# Patient Record
Sex: Female | Born: 1980 | Race: White | Hispanic: No | Marital: Single
Health system: Southern US, Community
[De-identification: ages and names within clinical notes are randomized; demographics above are authoritative.]

---

## 2005-06-13 ENCOUNTER — Other Ambulatory Visit: Admission: RE | Admit: 2005-06-13 | Discharge: 2005-06-13 | Payer: Self-pay | Admitting: Obstetrics and Gynecology

## 2006-04-30 ENCOUNTER — Ambulatory Visit: Payer: Self-pay | Admitting: Family Medicine

## 2006-05-12 ENCOUNTER — Ambulatory Visit: Payer: Self-pay | Admitting: Family Medicine

## 2006-05-16 ENCOUNTER — Ambulatory Visit: Payer: Self-pay | Admitting: Cardiology

## 2006-07-03 ENCOUNTER — Ambulatory Visit: Payer: Self-pay | Admitting: Family Medicine

## 2007-11-14 IMAGING — CT CT PELVIS W/ CM
2 of 6 series · 17 of 46 positions shown, 19 images · IV contrast (APPLIED)
Comparison: none

HISTORY: Recurrent UTI, family history renal disease, past history basal cell
carcinoma

[Series 2: abd_pel 5.0 b30f st · axial · 0.68mm/px · z∈[-675,-265]mm · 14 of 92 slices shown, 16 images]
[im 5/92  soft-tissue]
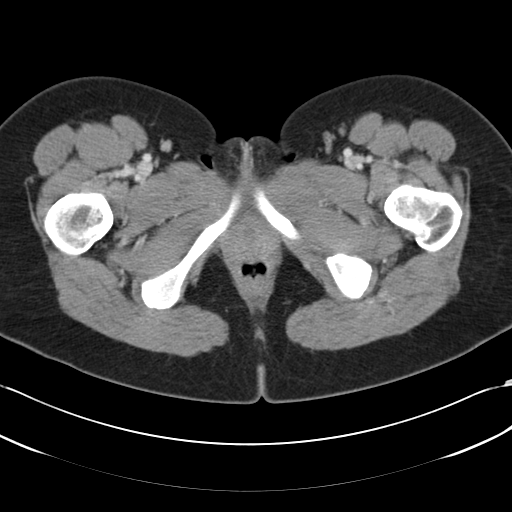
[im 5/92  bone]
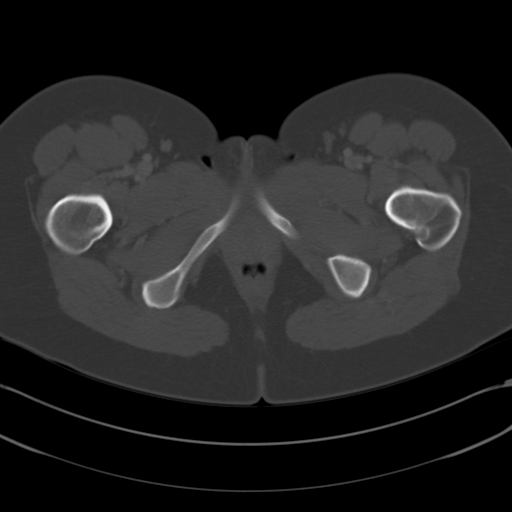
[im 14/92  soft-tissue]
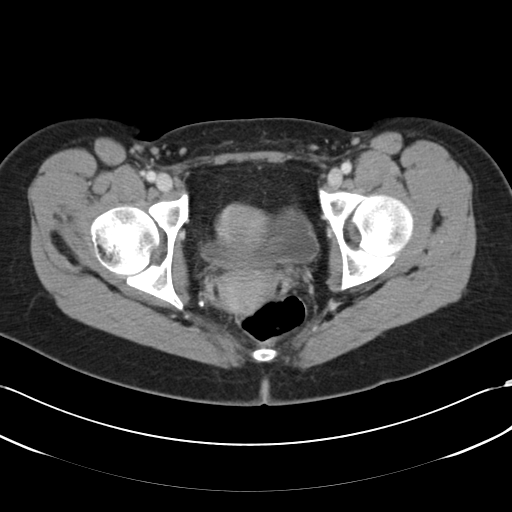
[im 19/92  soft-tissue]
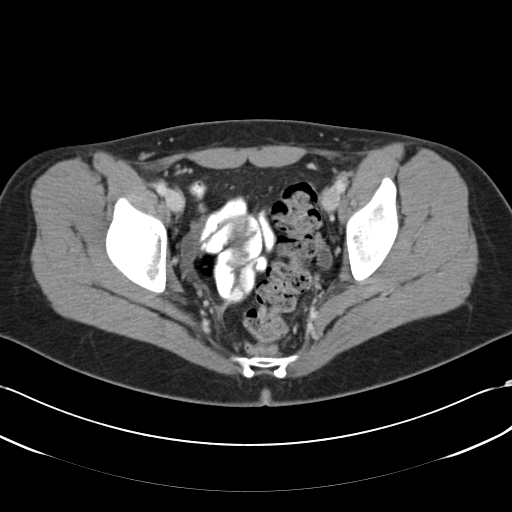
[im 23/92  soft-tissue]
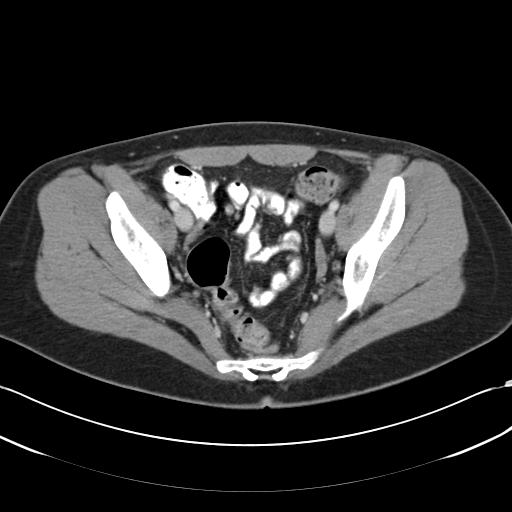
[im 32/92  soft-tissue]
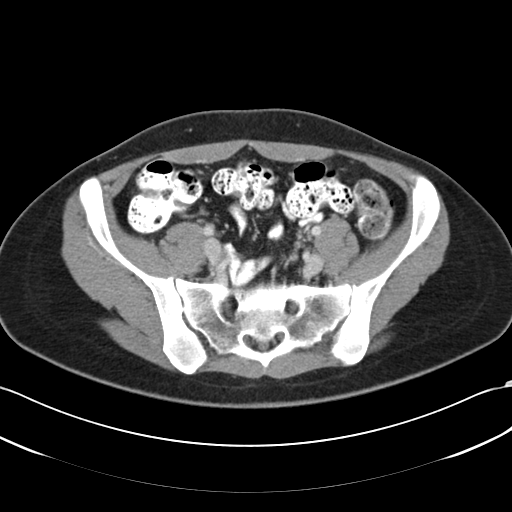
[im 37/92  soft-tissue]
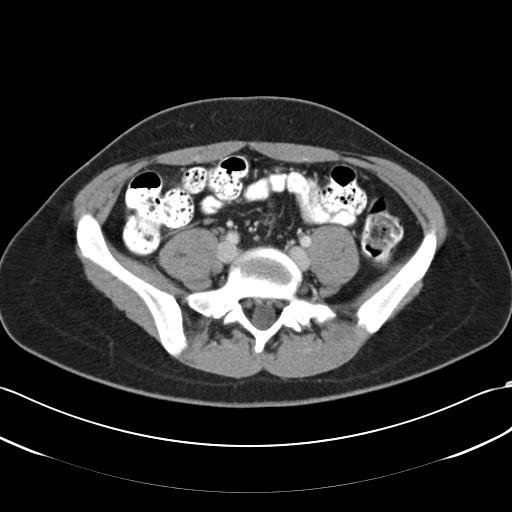
[im 41/92  soft-tissue]
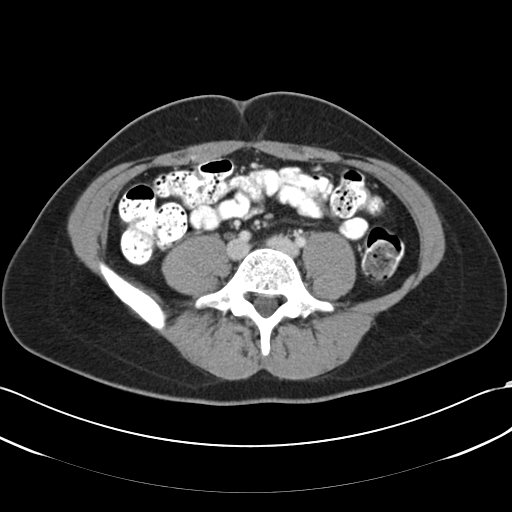
[im 51/92  soft-tissue]
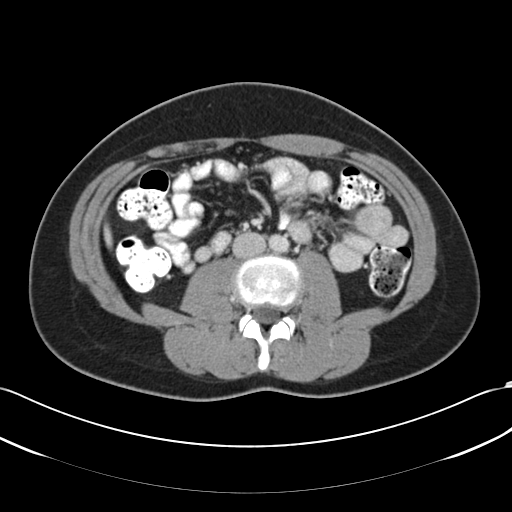
[im 55/92  soft-tissue]
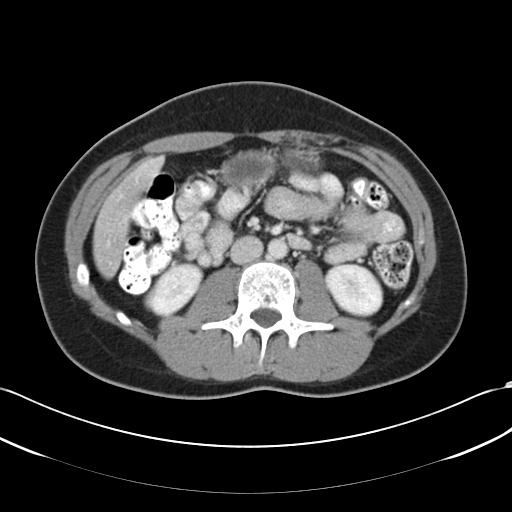
[im 55/92  bone]
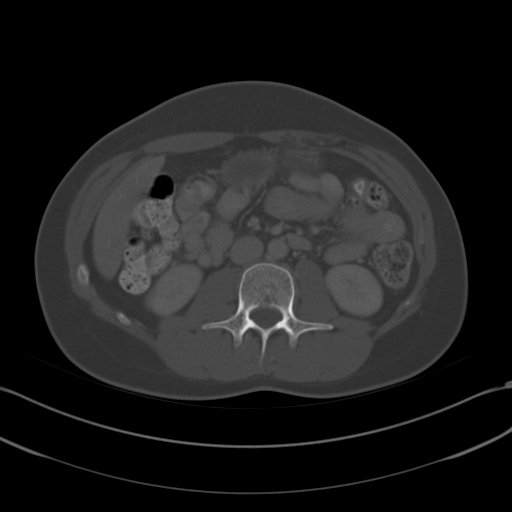
[im 60/92  soft-tissue]
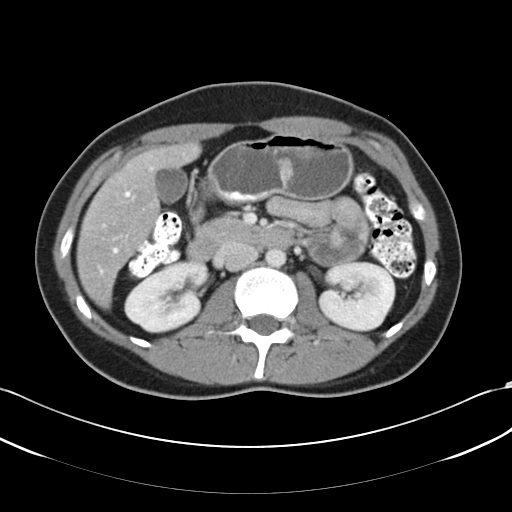
[im 69/92  soft-tissue]
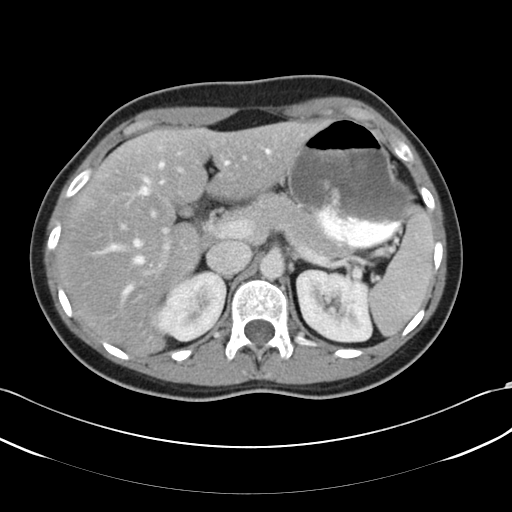
[im 73/92  soft-tissue]
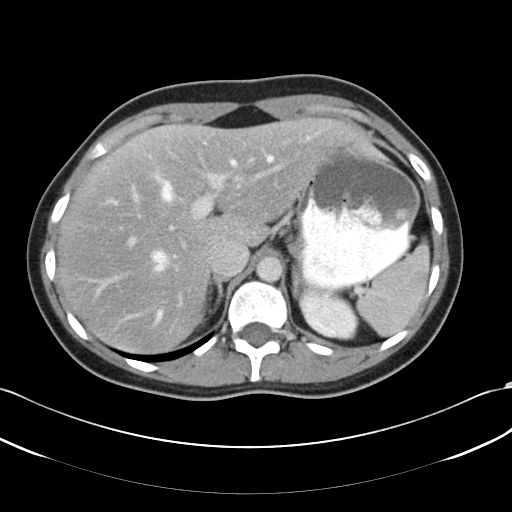
[im 78/92  soft-tissue]
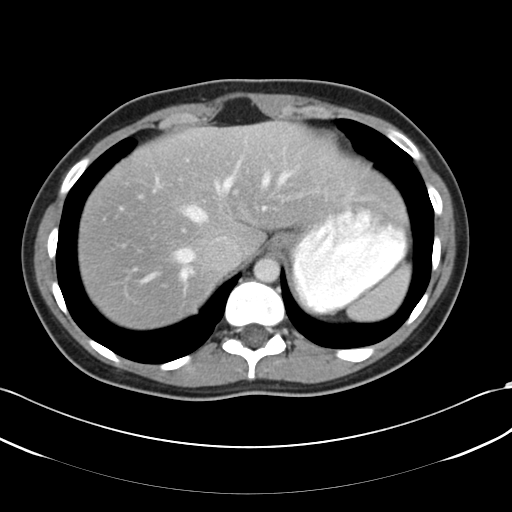
[im 87/92  soft-tissue]
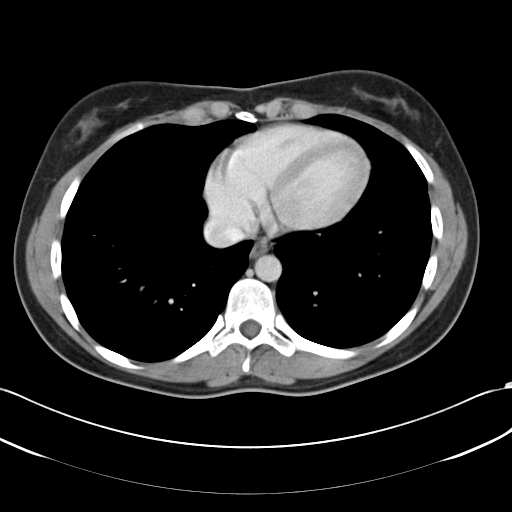

[Series 602: coronal images · coronal · 0.93mm/px · 3 of 39 slices shown]
[im 13/39  soft-tissue]
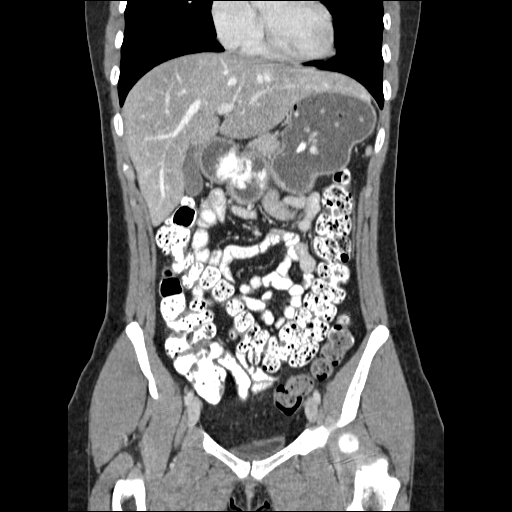
[im 17/39  soft-tissue]
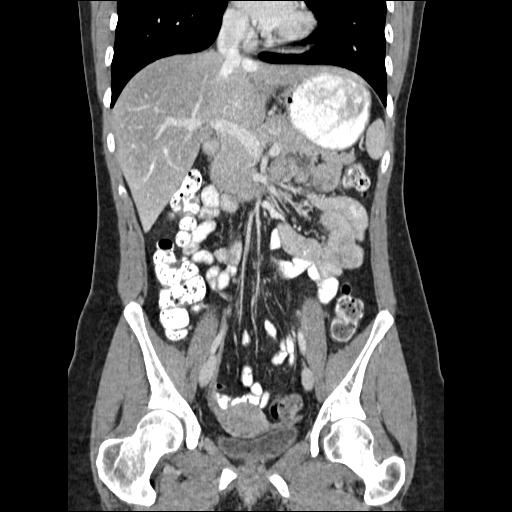
[im 22/39  soft-tissue]
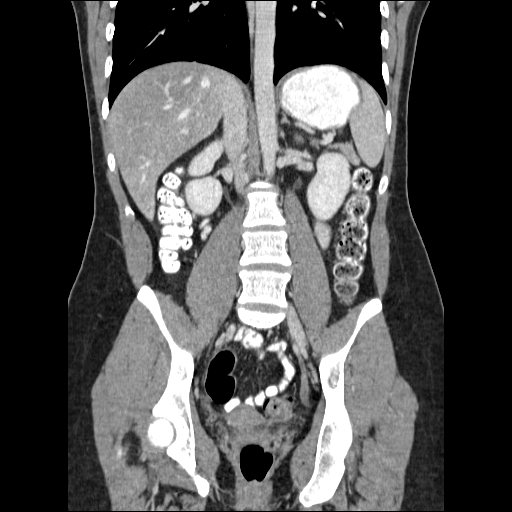

[17 of 46 positions shown; findings below may reference images not displayed]

CT ABDOMEN AND PELVIS WITH CONTRAST:

Multidetector helical CT imaging abdomen and pelvis performed.
Exam utilized dilute oral contrast and 125 cc [id number].
No prior study for comparison.

CT ABDOMEN:

Lung bases clear.
Small soft tissue nodule adjacent to stomach and spleen 8 mm diameter, similar
enhancement the spleen, question splenule versus lymph node.
Mild fatty infiltration of liver.
Liver, spleen, pancreas, kidneys, and adrenal glands otherwise normal.
Symmetric nephrograms.
No upper abdominal mass, adenopathy, free fluid, or inflammatory process.
Bowel loops and upper abdomen normal.
IMPRESSION: Mild fatty infiltration of liver.
Otherwise negative exam.

CT PELVIS:

Circular foreign body in vagina, question diaphragm.
Bladder and distal ureters unremarkable. 
Normal appendix and pelvic bowel loops.
Uterus and ovaries normal size for age.
No pelvic mass, adenopathy, free fluid, or inflammatory process.
Bones unremarkable.
IMPRESSION: No acute intrapelvic abnormalities.

## 2021-05-04 ENCOUNTER — Telehealth: Payer: Self-pay

## 2021-05-08 NOTE — Telephone Encounter (Signed)
Erroneous encounter
